# Patient Record
Sex: Male | Born: 1970 | Race: White | Hispanic: No | Marital: Married | Smoking: Former smoker
Health system: Southern US, Community
[De-identification: ages and names within clinical notes are randomized; demographics above are authoritative.]

## PROBLEM LIST (undated history)

## (undated) DIAGNOSIS — M7062 Trochanteric bursitis, left hip: Secondary | ICD-10-CM

## (undated) DIAGNOSIS — M2170 Unequal limb length (acquired), unspecified site: Secondary | ICD-10-CM

## (undated) DIAGNOSIS — D649 Anemia, unspecified: Secondary | ICD-10-CM

## (undated) DIAGNOSIS — M1990 Unspecified osteoarthritis, unspecified site: Secondary | ICD-10-CM

## (undated) DIAGNOSIS — M169 Osteoarthritis of hip, unspecified: Secondary | ICD-10-CM

## (undated) DIAGNOSIS — F32A Depression, unspecified: Secondary | ICD-10-CM

## (undated) DIAGNOSIS — M300 Polyarteritis nodosa: Secondary | ICD-10-CM

## (undated) DIAGNOSIS — M419 Scoliosis, unspecified: Secondary | ICD-10-CM

## (undated) DIAGNOSIS — D751 Secondary polycythemia: Secondary | ICD-10-CM

## (undated) HISTORY — PX: WISDOM TOOTH EXTRACTION: SHX21

## (undated) HISTORY — PX: BONE MARROW BIOPSY: SHX199

---

## 2011-06-18 ENCOUNTER — Ambulatory Visit: Payer: Self-pay | Admitting: Sports Medicine

## 2011-08-17 ENCOUNTER — Ambulatory Visit: Payer: Self-pay | Admitting: Orthopedic Surgery

## 2019-12-31 ENCOUNTER — Other Ambulatory Visit: Payer: Self-pay | Admitting: Orthopedic Surgery

## 2020-01-02 ENCOUNTER — Encounter
Admission: RE | Admit: 2020-01-02 | Discharge: 2020-01-02 | Disposition: A | Discharge status: Home / Self Care | Payer: Managed Care, Other (non HMO) | Source: Ambulatory Visit | Attending: Orthopedic Surgery | Admitting: Orthopedic Surgery

## 2020-01-02 ENCOUNTER — Other Ambulatory Visit: Payer: Self-pay

## 2020-01-02 DIAGNOSIS — Z01818 Encounter for other preprocedural examination: Secondary | ICD-10-CM | POA: Diagnosis not present

## 2020-01-02 DIAGNOSIS — Z01810 Encounter for preprocedural cardiovascular examination: Secondary | ICD-10-CM

## 2020-01-02 HISTORY — DX: Anemia, unspecified: D64.9

## 2020-01-02 HISTORY — DX: Depression, unspecified: F32.A

## 2020-01-02 HISTORY — DX: Polyarteritis nodosa: M30.0

## 2020-01-02 HISTORY — DX: Unspecified osteoarthritis, unspecified site: M19.90

## 2020-01-02 HISTORY — DX: Secondary polycythemia: D75.1

## 2020-01-02 HISTORY — DX: Scoliosis, unspecified: M41.9

## 2020-01-02 HISTORY — DX: Osteoarthritis of hip, unspecified: M16.9

## 2020-01-02 HISTORY — DX: Trochanteric bursitis, left hip: M70.62

## 2020-01-02 HISTORY — DX: Unequal limb length (acquired), unspecified site: M21.70

## 2020-01-02 LAB — CBC WITH DIFFERENTIAL/PLATELET
Abs Immature Granulocytes: 0.01 10*3/uL (ref 0.00–0.07)
Basophils Absolute: 0.1 10*3/uL (ref 0.0–0.1)
Basophils Relative: 1 %
Eosinophils Absolute: 0.1 10*3/uL (ref 0.0–0.5)
Eosinophils Relative: 1 %
HCT: 50.1 % (ref 39.0–52.0)
Hemoglobin: 17.7 g/dL — ABNORMAL HIGH (ref 13.0–17.0)
Immature Granulocytes: 0 %
Lymphocytes Relative: 24 %
Lymphs Abs: 1.7 10*3/uL (ref 0.7–4.0)
MCH: 31.6 pg (ref 26.0–34.0)
MCHC: 35.3 g/dL (ref 30.0–36.0)
MCV: 89.3 fL (ref 80.0–100.0)
Monocytes Absolute: 0.6 10*3/uL (ref 0.1–1.0)
Monocytes Relative: 9 %
Neutro Abs: 4.6 10*3/uL (ref 1.7–7.7)
Neutrophils Relative %: 65 %
Platelets: 304 10*3/uL (ref 150–400)
RBC: 5.61 MIL/uL (ref 4.22–5.81)
RDW: 12.7 % (ref 11.5–15.5)
WBC: 7.1 10*3/uL (ref 4.0–10.5)
nRBC: 0 % (ref 0.0–0.2)

## 2020-01-02 LAB — COMPREHENSIVE METABOLIC PANEL
ALT: 46 U/L — ABNORMAL HIGH (ref 0–44)
AST: 31 U/L (ref 15–41)
Albumin: 5 g/dL (ref 3.5–5.0)
Alkaline Phosphatase: 58 U/L (ref 38–126)
Anion gap: 10 (ref 5–15)
BUN: 17 mg/dL (ref 6–20)
CO2: 27 mmol/L (ref 22–32)
Calcium: 9.5 mg/dL (ref 8.9–10.3)
Chloride: 102 mmol/L (ref 98–111)
Creatinine, Ser: 0.95 mg/dL (ref 0.61–1.24)
GFR calc Af Amer: >60 mL/min (ref >60)
GFR calc non Af Amer: >60 mL/min (ref >60)
Glucose, Bld: 79 mg/dL (ref 70–99)
Potassium: 4 mmol/L (ref 3.5–5.1)
Sodium: 139 mmol/L (ref 135–145)
Total Bilirubin: 1 mg/dL (ref 0.3–1.2)
Total Protein: 7.8 g/dL (ref 6.5–8.1)

## 2020-01-02 LAB — URINALYSIS, ROUTINE W REFLEX MICROSCOPIC
Bilirubin Urine: NEGATIVE
Glucose, UA: NEGATIVE mg/dL
Hgb urine dipstick: NEGATIVE
Ketones, ur: 5 mg/dL — AB
Leukocytes,Ua: NEGATIVE
Nitrite: NEGATIVE
Protein, ur: NEGATIVE mg/dL
Specific Gravity, Urine: 1.011 (ref 1.005–1.030)
pH: 5 (ref 5.0–8.0)

## 2020-01-02 LAB — SURGICAL PCR SCREEN
MRSA, PCR: NEGATIVE
Staphylococcus aureus: NEGATIVE

## 2020-01-02 NOTE — Patient Instructions (Addendum)
Your procedure is scheduled on: 10-06- 2021  Report to Day Surgery on the 2nd floor of the [facility]. To find out your arrival time, please call [phone] between 1PM - 3PM on: 10- 04- 2021  REMEMBER: Instructions that are not followed completely may result in serious medical risk, up to and including death; or upon the discretion of your surgeon and anesthesiologist your surgery may need to be rescheduled.  Do not eat food after midnight the night before surgery.  No gum chewing, lozengers or hard candies.  You may however, drink CLEAR liquids up to 2 hours before you are scheduled to arrive for your surgery. Do not drink anything within 2 hours of your scheduled arrival time.  Clear liquids include: - water  - apple juice without pulp - gatorade (not RED) - black coffee or tea (Do NOT add milk or creamers to the coffee or tea) Do NOT drink anything that is not on this list.    In addition, your doctor has ordered for you to drink the provided  Ensure Pre-Surgery Clear Carbohydrate Drink  Gatorade G2 Drinking this carbohydrate drink up to two hours before surgery helps to reduce insulin resistance and improve patient outcomes. Please complete drinking 2 hours prior to scheduled arrival time. Helps reduce nausea.  TAKE THESE MEDICATIONS THE MORNING OF SURGERY WITH A SIP OF WATER:   - may take Tramadol if needed the morning of surgery.  Famotidine 20 mg ,take one the night before and one on the morning of surgery - helps to prevent nausea after surgery.   Follow recommendations from Cardiologist, Pulmonologist or PCP regarding stopping Aspirin, Coumadin, Plavix, Eliquis, Pradaxa, or Pletal.  One week prior to surgery: Stop Anti-inflammatories (NSAIDS) such as Meloxicam, Advil, Aleve, Ibuprofen, Motrin, Naproxen, Naprosyn and Aspirin based products such as Excedrin, Goodys Powder, BC Powder. Stop ANY OVER THE COUNTER supplements until after surgery - Fish Oil supplements 1  week prior to surgery. (You may continue taking Tylenol, Vitamin D, Vitamin B, and multivitamin.)  No Alcohol for 24 hours before or after surgery.  No Smoking including e-cigarettes for 24 hours prior to surgery.  No chewable tobacco products for at least 6 hours prior to surgery.  No nicotine patches on the day of surgery.  Do not use any "recreational" drugs for at least a week prior to your surgery.  Please be advised that the combination of cocaine and anesthesia may have negative outcomes, up to and including death. If you test positive for cocaine, your surgery will be cancelled.  On the morning of surgery brush your teeth with toothpaste and water, you may rinse your mouth with mouthwash if you wish. Do not swallow any toothpaste or mouthwash.  Do not wear jewelry, make-up, hairpins, clips or nail polish.  Do not wear lotions, deordorant,  powders, or perfumes.   Do not shave 48 hours prior to surgery.   Contact lenses, hearing aids and dentures may not be worn into surgery.  Do not bring valuables to the hospital. [facility] is not responsible for any missing/lost belongings or valuables.   Use CHG Soap or wipes as directed on instruction sheet.  Bring your C-PAP to the hospital with you in case you may have to spend the night.   Notify your doctor if there is any change in your medical condition (cold, fever, infection).  Wear comfortable clothing (specific to your surgery type) to the hospital.  Plan for stool softeners for home use; pain medications  have a tendency to cause constipation. You can also help prevent constipation by eating foods high in fiber such as fruits and vegetables and drinking plenty of fluids as your diet allows.  After surgery, you can help prevent lung complications by doing breathing exercises.  Take deep breaths and cough every 1-2 hours. Your doctor may order a device called an Incentive Spirometer to help you take deep breaths. When  coughing or sneezing, hold a pillow firmly against your incision with both hands. This is called "splinting." Doing this helps protect your incision. It also decreases belly discomfort.  If you are being admitted to the hospital overnight, leave your suitcase in the car. After surgery it may be brought to your room.  If you are being discharged the day of surgery, you will not be allowed to drive home. You will need a responsible adult (18 years or older) to drive you home and stay with you that night.   If you are taking public transportation, you will need to have a responsible adult (18 years or older) with you. Please confirm with your physician that it is acceptable to use public transportation.   Please call the Pre-admissions Testing Dept. at [phone] if you have any questions about these instructions.  Visitation Policy:  Patients undergoing a surgery or procedure may have one family member or support person with them as long as that person is not COVID-19 positive or experiencing its symptoms.  That person may remain in the waiting area during the procedure.  Inpatient Visitation Update:   In an effort to ensure the safety of our team members and our patients, we are implementing a change to our visitation policy:  Effective Monday, Aug. 9, at 7 a.m., inpatients will be allowed one support person.  o The support person may change daily.  o The support person must pass our screening, gel in and out, and wear a mask at all times, including in the patient's room.  o Patients must also wear a mask when staff or their support person are in the room.  o Masking is required regardless of vaccination status.  Systemwide, no visitors 17 or younger.

## 2020-01-03 LAB — ABO/RH: ABO/RH(D): O POS

## 2020-01-04 ENCOUNTER — Other Ambulatory Visit
Admission: RE | Admit: 2020-01-04 | Discharge: 2020-01-04 | Disposition: A | Discharge status: Home / Self Care | Payer: Managed Care, Other (non HMO) | Source: Ambulatory Visit | Attending: Orthopedic Surgery | Admitting: Orthopedic Surgery

## 2020-01-04 ENCOUNTER — Other Ambulatory Visit: Payer: Self-pay

## 2020-01-04 DIAGNOSIS — Z20822 Contact with and (suspected) exposure to covid-19: Secondary | ICD-10-CM | POA: Diagnosis not present

## 2020-01-04 DIAGNOSIS — Z01812 Encounter for preprocedural laboratory examination: Secondary | ICD-10-CM | POA: Diagnosis not present

## 2020-01-05 LAB — SARS CORONAVIRUS 2 (TAT 6-24 HRS): SARS Coronavirus 2: NEGATIVE

## 2020-01-08 ENCOUNTER — Ambulatory Visit: Payer: Managed Care, Other (non HMO)

## 2020-01-08 ENCOUNTER — Ambulatory Visit
Admission: RE | Admit: 2020-01-08 | Discharge: 2020-01-08 | Disposition: A | Discharge status: Home / Self Care | Payer: Managed Care, Other (non HMO) | Attending: Orthopedic Surgery | Admitting: Orthopedic Surgery

## 2020-01-08 ENCOUNTER — Encounter
Admission: RE | Disposition: A | Discharge status: Home / Self Care | Payer: Self-pay | Source: Home / Self Care | Attending: Orthopedic Surgery

## 2020-01-08 ENCOUNTER — Other Ambulatory Visit: Payer: Self-pay

## 2020-01-08 ENCOUNTER — Encounter: Payer: Self-pay | Admitting: Orthopedic Surgery

## 2020-01-08 ENCOUNTER — Ambulatory Visit: Payer: Managed Care, Other (non HMO) | Admitting: Anesthesiology

## 2020-01-08 ENCOUNTER — Ambulatory Visit: Payer: Managed Care, Other (non HMO) | Admitting: Urgent Care

## 2020-01-08 DIAGNOSIS — Z79899 Other long term (current) drug therapy: Secondary | ICD-10-CM | POA: Diagnosis not present

## 2020-01-08 DIAGNOSIS — Z8261 Family history of arthritis: Secondary | ICD-10-CM | POA: Diagnosis not present

## 2020-01-08 DIAGNOSIS — M1612 Unilateral primary osteoarthritis, left hip: Secondary | ICD-10-CM | POA: Insufficient documentation

## 2020-01-08 DIAGNOSIS — M419 Scoliosis, unspecified: Secondary | ICD-10-CM | POA: Insufficient documentation

## 2020-01-08 DIAGNOSIS — M2170 Unequal limb length (acquired), unspecified site: Secondary | ICD-10-CM | POA: Diagnosis not present

## 2020-01-08 DIAGNOSIS — Z419 Encounter for procedure for purposes other than remedying health state, unspecified: Secondary | ICD-10-CM

## 2020-01-08 DIAGNOSIS — Z881 Allergy status to other antibiotic agents status: Secondary | ICD-10-CM | POA: Insufficient documentation

## 2020-01-08 DIAGNOSIS — G8918 Other acute postprocedural pain: Secondary | ICD-10-CM

## 2020-01-08 DIAGNOSIS — R2681 Unsteadiness on feet: Secondary | ICD-10-CM | POA: Diagnosis not present

## 2020-01-08 DIAGNOSIS — D751 Secondary polycythemia: Secondary | ICD-10-CM | POA: Diagnosis not present

## 2020-01-08 DIAGNOSIS — E669 Obesity, unspecified: Secondary | ICD-10-CM | POA: Diagnosis not present

## 2020-01-08 DIAGNOSIS — Z6829 Body mass index (BMI) 29.0-29.9, adult: Secondary | ICD-10-CM | POA: Diagnosis not present

## 2020-01-08 HISTORY — PX: TOTAL HIP ARTHROPLASTY: SHX124

## 2020-01-08 LAB — URINE DRUG SCREEN, QUALITATIVE (ARMC ONLY)
Amphetamines, Ur Screen: NOT DETECTED
Barbiturates, Ur Screen: NOT DETECTED
Benzodiazepine, Ur Scrn: NOT DETECTED
Cannabinoid 50 Ng, Ur ~~LOC~~: POSITIVE — AB
Cocaine Metabolite,Ur ~~LOC~~: NOT DETECTED
MDMA (Ecstasy)Ur Screen: NOT DETECTED
Methadone Scn, Ur: NOT DETECTED
Opiate, Ur Screen: NOT DETECTED
Phencyclidine (PCP) Ur S: NOT DETECTED
Tricyclic, Ur Screen: NOT DETECTED

## 2020-01-08 LAB — CBC
HCT: 42.5 % (ref 39.0–52.0)
Hemoglobin: 15.1 g/dL (ref 13.0–17.0)
MCH: 31.9 pg (ref 26.0–34.0)
MCHC: 35.5 g/dL (ref 30.0–36.0)
MCV: 89.9 fL (ref 80.0–100.0)
Platelets: 250 10*3/uL (ref 150–400)
RBC: 4.73 MIL/uL (ref 4.22–5.81)
RDW: 12.8 % (ref 11.5–15.5)
WBC: 12.9 10*3/uL — ABNORMAL HIGH (ref 4.0–10.5)
nRBC: 0 % (ref 0.0–0.2)

## 2020-01-08 LAB — TYPE AND SCREEN
ABO/RH(D): O POS
ABO/RH(D): O POS
Antibody Screen: NEGATIVE
Antibody Screen: NEGATIVE

## 2020-01-08 LAB — CREATININE, SERUM
Creatinine, Ser: 0.98 mg/dL (ref 0.61–1.24)
GFR calc non Af Amer: >60 mL/min (ref >60)

## 2020-01-08 SURGERY — ARTHROPLASTY, HIP, TOTAL, ANTERIOR APPROACH
Anesthesia: Spinal | Site: Hip | Laterality: Left

## 2020-01-08 MED ORDER — ONDANSETRON HCL 4 MG PO TABS: 4.0000 mg | ORAL_TABLET | Freq: Four times a day (QID) | ORAL | Status: DC | PRN

## 2020-01-08 MED ORDER — ONDANSETRON HCL 4 MG/2ML IJ SOLN: 4.0000 mg | Freq: Four times a day (QID) | INTRAMUSCULAR | Status: DC | PRN

## 2020-01-08 MED ORDER — DOCUSATE SODIUM 100 MG PO CAPS
100.0000 mg | ORAL_CAPSULE | Freq: Two times a day (BID) | ORAL | Status: DC
  Administered 2020-01-08: 100 mg via ORAL
  Filled 2020-01-08: qty 1

## 2020-01-08 MED ORDER — GLYCOPYRROLATE 0.2 MG/ML IJ SOLN
INTRAMUSCULAR | Status: DC | PRN
  Administered 2020-01-08: .2 mg via INTRAVENOUS

## 2020-01-08 MED ORDER — BUPIVACAINE HCL (PF) 0.5 % IJ SOLN
INTRAMUSCULAR | Status: AC
  Filled 2020-01-08: qty 10

## 2020-01-08 MED ORDER — PHENOL 1.4 % MT LIQD
1.0000 | OROMUCOSAL | Status: DC | PRN
  Filled 2020-01-08: qty 177

## 2020-01-08 MED ORDER — PHENYLEPHRINE HCL (PRESSORS) 10 MG/ML IV SOLN
INTRAVENOUS | Status: DC | PRN
  Administered 2020-01-08: 100 ug via INTRAVENOUS

## 2020-01-08 MED ORDER — LIDOCAINE HCL (PF) 2 % IJ SOLN
INTRAMUSCULAR | Status: AC
  Filled 2020-01-08: qty 5

## 2020-01-08 MED ORDER — CHLORHEXIDINE GLUCONATE 0.12 % MT SOLN: 15.0000 mL | Freq: Once | OROMUCOSAL | Status: AC

## 2020-01-08 MED ORDER — CEFAZOLIN SODIUM-DEXTROSE 2-4 GM/100ML-% IV SOLN
INTRAVENOUS | Status: AC
  Administered 2020-01-08: 2 g via INTRAVENOUS
  Filled 2020-01-08: qty 100

## 2020-01-08 MED ORDER — LACTATED RINGERS IV SOLN: INTRAVENOUS | Status: DC

## 2020-01-08 MED ORDER — METOCLOPRAMIDE HCL 5 MG/ML IJ SOLN: 5.0000 mg | Freq: Three times a day (TID) | INTRAMUSCULAR | Status: DC | PRN

## 2020-01-08 MED ORDER — FENTANYL CITRATE (PF) 100 MCG/2ML IJ SOLN
INTRAMUSCULAR | Status: DC | PRN
  Administered 2020-01-08: 100 ug via INTRAVENOUS

## 2020-01-08 MED ORDER — CEFAZOLIN SODIUM-DEXTROSE 2-4 GM/100ML-% IV SOLN
2.0000 g | INTRAVENOUS | Status: AC
  Administered 2020-01-08: 2 g via INTRAVENOUS

## 2020-01-08 MED ORDER — BUPIVACAINE LIPOSOME 1.3 % IJ SUSP
INTRAMUSCULAR | Status: AC
  Filled 2020-01-08: qty 20

## 2020-01-08 MED ORDER — ACETAMINOPHEN 500 MG PO TABS
ORAL_TABLET | ORAL | Status: AC
  Administered 2020-01-08: 1000 mg via ORAL
  Filled 2020-01-08: qty 2

## 2020-01-08 MED ORDER — ENOXAPARIN SODIUM 40 MG/0.4ML ~~LOC~~ SOLN: 40.0000 mg | SUBCUTANEOUS | Status: DC

## 2020-01-08 MED ORDER — ACETAMINOPHEN 325 MG PO TABS: 325.0000 mg | ORAL_TABLET | Freq: Four times a day (QID) | ORAL | Status: DC | PRN

## 2020-01-08 MED ORDER — METOCLOPRAMIDE HCL 10 MG PO TABS: 5.0000 mg | ORAL_TABLET | Freq: Three times a day (TID) | ORAL | Status: DC | PRN

## 2020-01-08 MED ORDER — ORAL CARE MOUTH RINSE: 15.0000 mL | Freq: Once | OROMUCOSAL | Status: AC

## 2020-01-08 MED ORDER — FENTANYL CITRATE (PF) 100 MCG/2ML IJ SOLN: 25.0000 ug | INTRAMUSCULAR | Status: DC | PRN

## 2020-01-08 MED ORDER — FENTANYL CITRATE (PF) 100 MCG/2ML IJ SOLN
INTRAMUSCULAR | Status: AC
  Filled 2020-01-08: qty 2

## 2020-01-08 MED ORDER — CEFAZOLIN SODIUM-DEXTROSE 2-4 GM/100ML-% IV SOLN
INTRAVENOUS | Status: AC
  Filled 2020-01-08: qty 100

## 2020-01-08 MED ORDER — GLYCOPYRROLATE 0.2 MG/ML IJ SOLN
INTRAMUSCULAR | Status: AC
  Filled 2020-01-08: qty 1

## 2020-01-08 MED ORDER — EPHEDRINE 5 MG/ML INJ
INTRAVENOUS | Status: AC
  Filled 2020-01-08: qty 10

## 2020-01-08 MED ORDER — ACETAMINOPHEN 10 MG/ML IV SOLN
INTRAVENOUS | Status: AC
  Filled 2020-01-08: qty 100

## 2020-01-08 MED ORDER — CEFAZOLIN SODIUM-DEXTROSE 2-4 GM/100ML-% IV SOLN: 2.0000 g | Freq: Four times a day (QID) | INTRAVENOUS | Status: DC

## 2020-01-08 MED ORDER — MENTHOL 3 MG MT LOZG
1.0000 | LOZENGE | OROMUCOSAL | Status: DC | PRN
  Filled 2020-01-08: qty 9

## 2020-01-08 MED ORDER — KETAMINE HCL 50 MG/ML IJ SOLN
INTRAMUSCULAR | Status: DC | PRN
  Administered 2020-01-08: 50 mg via INTRAVENOUS

## 2020-01-08 MED ORDER — ENOXAPARIN SODIUM 40 MG/0.4ML ~~LOC~~ SOLN: 40.0000 mg | SUBCUTANEOUS | 0 refills | Status: AC

## 2020-01-08 MED ORDER — PROPOFOL 10 MG/ML IV BOLUS
INTRAVENOUS | Status: AC
  Filled 2020-01-08: qty 40

## 2020-01-08 MED ORDER — SODIUM CHLORIDE 0.9 % IV SOLN
INTRAVENOUS | Status: DC | PRN
  Administered 2020-01-08: 60 mL

## 2020-01-08 MED ORDER — ONDANSETRON HCL 4 MG/2ML IJ SOLN: 4.0000 mg | Freq: Once | INTRAMUSCULAR | Status: DC | PRN

## 2020-01-08 MED ORDER — HYDROMORPHONE HCL 1 MG/ML IJ SOLN: 0.5000 mg | INTRAMUSCULAR | Status: DC | PRN

## 2020-01-08 MED ORDER — MIDAZOLAM HCL 5 MG/5ML IJ SOLN
INTRAMUSCULAR | Status: DC | PRN
  Administered 2020-01-08: 2 mg via INTRAVENOUS
  Administered 2020-01-08 (×2): 1 mg via INTRAVENOUS

## 2020-01-08 MED ORDER — LACTATED RINGERS IV BOLUS
250.0000 mL | Freq: Once | INTRAVENOUS | Status: AC
  Administered 2020-01-08: 250 mL via INTRAVENOUS

## 2020-01-08 MED ORDER — MIDAZOLAM HCL 2 MG/2ML IJ SOLN
INTRAMUSCULAR | Status: AC
  Filled 2020-01-08: qty 2

## 2020-01-08 MED ORDER — ACETAMINOPHEN 10 MG/ML IV SOLN
INTRAVENOUS | Status: DC | PRN
  Administered 2020-01-08: 1000 mg via INTRAVENOUS

## 2020-01-08 MED ORDER — EPHEDRINE SULFATE 50 MG/ML IJ SOLN
INTRAMUSCULAR | Status: DC | PRN
  Administered 2020-01-08: 10 mg via INTRAVENOUS
  Administered 2020-01-08 (×2): 5 mg via INTRAVENOUS
  Administered 2020-01-08: 10 mg via INTRAVENOUS

## 2020-01-08 MED ORDER — TRANEXAMIC ACID-NACL 1000-0.7 MG/100ML-% IV SOLN: 1000.0000 mg | Freq: Once | INTRAVENOUS | Status: AC

## 2020-01-08 MED ORDER — SODIUM CHLORIDE 0.9 % IV SOLN: INTRAVENOUS | Status: DC

## 2020-01-08 MED ORDER — METHOCARBAMOL 500 MG PO TABS: 500.0000 mg | ORAL_TABLET | Freq: Four times a day (QID) | ORAL | Status: DC | PRN

## 2020-01-08 MED ORDER — SODIUM CHLORIDE FLUSH 0.9 % IV SOLN
INTRAVENOUS | Status: AC
  Filled 2020-01-08: qty 40

## 2020-01-08 MED ORDER — LIDOCAINE HCL (PF) 2 % IJ SOLN
INTRAMUSCULAR | Status: DC | PRN
  Administered 2020-01-08: 50 mg

## 2020-01-08 MED ORDER — BUPIVACAINE HCL (PF) 0.5 % IJ SOLN
INTRAMUSCULAR | Status: DC | PRN
  Administered 2020-01-08: 3 mL via INTRATHECAL

## 2020-01-08 MED ORDER — METHOCARBAMOL 1000 MG/10ML IJ SOLN
500.0000 mg | Freq: Four times a day (QID) | INTRAVENOUS | Status: DC | PRN
  Filled 2020-01-08: qty 5

## 2020-01-08 MED ORDER — OXYCODONE HCL 5 MG PO TABS: 5.0000 mg | ORAL_TABLET | ORAL | 0 refills | Status: AC | PRN

## 2020-01-08 MED ORDER — ACETAMINOPHEN 500 MG PO TABS: 1000.0000 mg | ORAL_TABLET | Freq: Four times a day (QID) | ORAL | Status: DC

## 2020-01-08 MED ORDER — LACTATED RINGERS IV BOLUS
500.0000 mL | Freq: Once | INTRAVENOUS | Status: AC
  Administered 2020-01-08: 500 mL via INTRAVENOUS

## 2020-01-08 MED ORDER — OXYCODONE HCL 5 MG PO TABS
ORAL_TABLET | ORAL | Status: AC
  Filled 2020-01-08: qty 1

## 2020-01-08 MED ORDER — NEOMYCIN-POLYMYXIN B GU 40-200000 IR SOLN
Status: AC
  Filled 2020-01-08: qty 4

## 2020-01-08 MED ORDER — NEOMYCIN-POLYMYXIN B GU 40-200000 IR SOLN
Status: DC | PRN
  Administered 2020-01-08: 4 mL

## 2020-01-08 MED ORDER — OXYCODONE HCL 5 MG PO TABS
5.0000 mg | ORAL_TABLET | ORAL | Status: DC | PRN
  Administered 2020-01-08: 10 mg via ORAL
  Filled 2020-01-08 (×3): qty 2

## 2020-01-08 MED ORDER — BUPIVACAINE-EPINEPHRINE (PF) 0.25% -1:200000 IJ SOLN
INTRAMUSCULAR | Status: AC
  Filled 2020-01-08: qty 30

## 2020-01-08 MED ORDER — OXYCODONE HCL 5 MG PO TABS
ORAL_TABLET | ORAL | Status: AC
  Administered 2020-01-08: 5 mg via ORAL
  Filled 2020-01-08: qty 2

## 2020-01-08 MED ORDER — KETAMINE HCL 50 MG/ML IJ SOLN
INTRAMUSCULAR | Status: AC
  Filled 2020-01-08: qty 10

## 2020-01-08 MED ORDER — PROPOFOL 500 MG/50ML IV EMUL
INTRAVENOUS | Status: DC | PRN
  Administered 2020-01-08: 35 ug/kg/min via INTRAVENOUS

## 2020-01-08 MED ORDER — OXYCODONE HCL 5 MG PO TABS
10.0000 mg | ORAL_TABLET | ORAL | Status: DC | PRN
  Filled 2020-01-08: qty 3

## 2020-01-08 MED ORDER — CHLORHEXIDINE GLUCONATE 0.12 % MT SOLN
OROMUCOSAL | Status: AC
  Administered 2020-01-08: 15 mL via OROMUCOSAL
  Filled 2020-01-08: qty 15

## 2020-01-08 MED ORDER — TRANEXAMIC ACID-NACL 1000-0.7 MG/100ML-% IV SOLN
INTRAVENOUS | Status: AC
  Administered 2020-01-08: 1000 mg via INTRAVENOUS
  Filled 2020-01-08: qty 100

## 2020-01-08 SURGICAL SUPPLY — 63 items
BLADE SAGITTAL AGGR TOOTH XLG (BLADE) ×3 IMPLANT
BNDG COHESIVE 6X5 TAN STRL LF (GAUZE/BANDAGES/DRESSINGS) ×9 IMPLANT
CANISTER SUCT 1200ML W/VALVE (MISCELLANEOUS) ×3 IMPLANT
CANISTER WOUND CARE 500ML ATS (WOUND CARE) ×3 IMPLANT
CHLORAPREP W/TINT 26 (MISCELLANEOUS) ×3 IMPLANT
COVER BACK TABLE REUSABLE LG (DRAPES) ×3 IMPLANT
COVER WAND RF STERILE (DRAPES) ×3 IMPLANT
DRAPE 3/4 80X56 (DRAPES) ×9 IMPLANT
DRAPE C-ARM XRAY 36X54 (DRAPES) ×3 IMPLANT
DRAPE INCISE IOBAN 66X60 STRL (DRAPES) IMPLANT
DRAPE POUCH INSTRU U-SHP 10X18 (DRAPES) ×3 IMPLANT
DRESSING SURGICEL FIBRLLR 1X2 (HEMOSTASIS) ×2 IMPLANT
DRSG MEPILEX SACRM 8.7X9.8 (GAUZE/BANDAGES/DRESSINGS) ×3 IMPLANT
DRSG OPSITE POSTOP 4X8 (GAUZE/BANDAGES/DRESSINGS) ×6 IMPLANT
DRSG SURGICEL FIBRILLAR 1X2 (HEMOSTASIS) ×6
ELECT BLADE 6.5 EXT (BLADE) ×3 IMPLANT
ELECT REM PT RETURN 9FT ADLT (ELECTROSURGICAL) ×3
ELECTRODE REM PT RTRN 9FT ADLT (ELECTROSURGICAL) ×1 IMPLANT
GLOVE BIOGEL PI IND STRL 9 (GLOVE) ×1 IMPLANT
GLOVE BIOGEL PI INDICATOR 9 (GLOVE) ×2
GLOVE SURG SYN 9.0  PF PI (GLOVE) ×4
GLOVE SURG SYN 9.0 PF PI (GLOVE) ×2 IMPLANT
GOWN SRG 2XL LVL 4 RGLN SLV (GOWNS) ×1 IMPLANT
GOWN STRL NON-REIN 2XL LVL4 (GOWNS) ×2
GOWN STRL REUS W/ TWL LRG LVL3 (GOWN DISPOSABLE) ×1 IMPLANT
GOWN STRL REUS W/TWL LRG LVL3 (GOWN DISPOSABLE) ×2
HEAD FEMORAL 28MM SZ S (Head) ×2 IMPLANT
HEMOVAC 400CC 10FR (MISCELLANEOUS) IMPLANT
HOLDER FOLEY CATH W/STRAP (MISCELLANEOUS) ×3 IMPLANT
HOOD PEEL AWAY FLYTE STAYCOOL (MISCELLANEOUS) ×3 IMPLANT
IRRIGATION SURGIPHOR STRL (IV SOLUTION) IMPLANT
KIT PREVENA INCISION MGT 13 (CANNISTER) ×3 IMPLANT
LINER DM 28MM (Liner) ×3 IMPLANT
LINER DM SZH 28X56 (Liner) IMPLANT
MASTERLOC HIP LATERAL S6 (Hips) ×2 IMPLANT
MAT ABSORB  FLUID 56X50 GRAY (MISCELLANEOUS) ×2
MAT ABSORB FLUID 56X50 GRAY (MISCELLANEOUS) ×1 IMPLANT
NDL SAFETY ECLIPSE 18X1.5 (NEEDLE) ×1 IMPLANT
NDL SPNL 20GX3.5 QUINCKE YW (NEEDLE) ×2 IMPLANT
NEEDLE HYPO 18GX1.5 SHARP (NEEDLE) ×2
NEEDLE SPNL 20GX3.5 QUINCKE YW (NEEDLE) ×6 IMPLANT
NS IRRIG 1000ML POUR BTL (IV SOLUTION) ×3 IMPLANT
PACK HIP COMPR (MISCELLANEOUS) ×3 IMPLANT
SCALPEL PROTECTED #10 DISP (BLADE) ×6 IMPLANT
SHELL ACETABULAR DM  56MM (Shell) ×2 IMPLANT
SOL PREP PVP 2OZ (MISCELLANEOUS) ×3
SOLUTION PREP PVP 2OZ (MISCELLANEOUS) ×1 IMPLANT
SPONGE DRAIN TRACH 4X4 STRL 2S (GAUZE/BANDAGES/DRESSINGS) ×3 IMPLANT
STAPLER SKIN PROX 35W (STAPLE) ×3 IMPLANT
STRAP SAFETY 5IN WIDE (MISCELLANEOUS) ×3 IMPLANT
SUT DVC 2 QUILL PDO  T11 36X36 (SUTURE) ×2
SUT DVC 2 QUILL PDO T11 36X36 (SUTURE) ×1 IMPLANT
SUT SILK 0 (SUTURE) ×2
SUT SILK 0 30XBRD TIE 6 (SUTURE) ×1 IMPLANT
SUT V-LOC 90 ABS DVC 3-0 CL (SUTURE) ×3 IMPLANT
SUT VIC AB 1 CT1 36 (SUTURE) ×3 IMPLANT
SYR 20ML LL LF (SYRINGE) ×3 IMPLANT
SYR 30ML LL (SYRINGE) ×3 IMPLANT
SYR 50ML LL SCALE MARK (SYRINGE) ×6 IMPLANT
SYR BULB IRRIG 60ML STRL (SYRINGE) ×3 IMPLANT
TAPE MICROFOAM 4IN (TAPE) ×3 IMPLANT
TOWEL OR 17X26 4PK STRL BLUE (TOWEL DISPOSABLE) ×3 IMPLANT
TRAY FOLEY MTR SLVR 16FR STAT (SET/KITS/TRAYS/PACK) ×3 IMPLANT

## 2020-01-08 NOTE — Transfer of Care (Signed)
Immediate Anesthesia Transfer of Care Note  Patient: RANEY KOEPPEN  Procedure(s) Performed: TOTAL HIP ARTHROPLASTY ANTERIOR APPROACH (Left Hip)  Patient Location: PACU  Anesthesia Type:Spinal  Level of Consciousness: awake, alert  and oriented  Airway & Oxygen Therapy: Patient Spontanous Breathing  Post-op Assessment: Report given to RN and Post -op Vital signs reviewed and stable  Post vital signs: Reviewed  Last Vitals:  Vitals Value Taken Time  BP    Temp    Pulse 94 01/08/20 0900  Resp    SpO2 98 % 01/08/20 0900  Vitals shown include unvalidated device data.  Last Pain:  Vitals:   01/08/20 0641  TempSrc: Oral  PainSc: 5          Complications: No complications documented.

## 2020-01-08 NOTE — H&P (Signed)
Chief Complaint  Patient presents with  . Establish Care  Lt Hip OA    History of the Present Illness: Edwin Bryant is a 49 y.o. male here today.   The patient presents for evaluation of left hip osteoarthritis. He had x-rays in 09/25/2019. These show severe osteoarthritis of the left hip. Extensive osteophytes around the femoral head and neck. The right hip is nearly as involved. Lateral view shows subchondral cyst, large osteophytes at the medial acetabulum, as well as posterior femoral head and anterior femoral head. He had a corticosteroid injection by Edwin Jungling, MD, and sent that x-ray.  The patient states his right hip is not as painful as it once was. The left hip pain has become progressively worse over time, faster than his right. He locates his pain to the groin area with radiation to the left shin muscle that is a throbbing pain at times. The patient states the left hip injection helped for approximately 2 weeks. He states this period of relief is the shortest amount of time from all the injections he has had.   The patient presents with a male companion who states when the patient was approximately 49 years old he was treated with massive doses of steroids.   The patient has a family history of osteoarthritis. He states his mother had similar issues and had to have a hip arthroplasty approximately 5 years ago or 4 years ago.  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 He enjoys walking, exercising, and playing golf.   I have reviewed past medical, surgical, social and family history, and allergies as documented in the EMR.  Past Medical History: Past Medical History:  Diagnosis Date  . Allergic state  . Depression 01/27/2011  . 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 Leg length discrepancy 09/16/2017  . 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 Scoliosis 09/16/2017   Past Surgical History: Past Surgical History:  Procedure  Laterality Date  . BIOPSY BONE MARROW  . wisdom teeth extraction   Past Family History: Family History  Problem Relation Age of Onset  . Arthritis Mother  . Arthritis Father   Medications: Current Outpatient Medications Ordered in Epic  Medication Sig Dispense Refill  . cetirizine (ZYRTEC) 10 MG tablet Take 10 mg by mouth as needed for Allergies  . fluticasone propionate (FLONASE) 50 mcg/actuation nasal spray Place 2 sprays into both nostrils once daily as needed for Allergies  . loratadine (CLARITIN) 10 mg tablet Take 10 mg by mouth once daily  . meloxicam (MOBIC) 15 MG tablet Take 1 tablet by mouth once daily  . multivitamin tablet Take 1 tablet by mouth once daily  . naproxen sodium (ALEVE) 220 MG tablet Take 220 mg by mouth 2 (two) times daily as needed for Pain  . traMADoL (ULTRAM) 50 mg tablet Take 1 tablet (50 mg total) by mouth every 6 (six) hours as needed for Pain for up to 30 doses 30 tablet 0   No current Epic-ordered facility-administered medications on file.   Allergies: Allergies  Allergen Reactions  . Tetracycline Unknown    Body mass index is 30.54 kg/m.  Review of Systems: A comprehensive 14 point ROS was performed, reviewed, and the pertinent orthopaedic findings are documented in the HPI.  Vitals:  12/28/19 0806  BP: 128/74    General Physical Examination:   General/Constitutional: No apparent distress: well-nourished and well developed. Eyes: Pupils equal, round with synchronous movement. Lungs: Clear to auscultation HEENT: Normal Vascular: No  edema, swelling or tenderness, except as noted in detailed exam. Cardiac: Heart rate and rhythm is regular. Integumentary: No impressive skin lesions present, except as noted in detailed exam. Neuro/Psych: Normal mood and affect, oriented to person, place and time  Musculoskeletal Examination:  On exam, left hip has 5 degrees internal and 10 degrees external rotation. Left hip has a 10 degree flexion  contracture. Antalgic gait, left greater than right.   Radiographs:  No new imaging studies were obtained or reviewed today.  Assessment: ICD-10-CM  1. Primary osteoarthritis of left hip M16.12  2. Obesity (BMI 30.0-34.9), unspecified E66.9   Plan:  The patient has clinical findings of severe bilateral hip arthritis, left is symptomatic at this time.  We discussed the patient's prior x-ray findings. I explained his osteoarthritis has progressed. Recommendation after failure of injection, anti-inflammatory, and activity modification is for left hip arthroplasty. I explained the surgery and postoperative course in detail. The risks, benefits, and possible complications were discussed.   Surgical Risks:  The nature of the condition and the proposed procedure has been reviewed in detail with the patient. Surgical versus non-surgical options and prognosis for recovery have been reviewed and the inherent risks and benefits of each have been discussed including the risks of infection, bleeding, injury to nerves/blood vessels/tendons, incomplete relief of symptoms, persisting pain and/or stiffness, loss of function, complex regional pain syndrome, failure of the procedure, as appropriate.  Teeth: normal  Attestation: I, Dawn Royse, am documenting for [facility], MD utilizing Nuance DAX.     Electronically signed by Marlena Clipper, MD at 12/28/2019 12:24 PM EDT   Reviewed  H+P. No changes noted.

## 2020-01-08 NOTE — Anesthesia Preprocedure Evaluation (Signed)
Anesthesia Evaluation  Patient identified by MRN, date of birth, ID band Patient awake    Reviewed: Allergy & Precautions, NPO status , Patient's Chart, lab work & pertinent test results  Airway Mallampati: II       Dental   Pulmonary former smoker,    Pulmonary exam normal        Cardiovascular negative cardio ROS Normal cardiovascular exam     Neuro/Psych PSYCHIATRIC DISORDERS Depression negative neurological ROS     GI/Hepatic negative GI ROS, Neg liver ROS,   Endo/Other  negative endocrine ROS  Renal/GU negative Renal ROS  negative genitourinary   Musculoskeletal  (+) Arthritis , Osteoarthritis,    Abdominal Normal abdominal exam  (+)   Peds negative pediatric ROS (+)  Hematology  (+) anemia ,   Anesthesia Other Findings Past Medical History: No date: Anemia     Comment:  30 years hx No date: Arthritis No date: Depression No date: Gaisbock's syndrome No date: Greater trochanteric bursitis of left hip No date: Leg length discrepancy No date: OA (osteoarthritis) of hip No date: Polyarteritis nodosa (HCC) No date: Scoliosis  Reproductive/Obstetrics                             Anesthesia Physical Anesthesia Plan  ASA: II  Anesthesia Plan:    Post-op Pain Management:    Induction:   PONV Risk Score and Plan:   Airway Management Planned:   Additional Equipment:   Intra-op Plan:   Post-operative Plan:   Informed Consent:   Plan Discussed with:   Anesthesia Plan Comments:         Anesthesia Quick Evaluation

## 2020-01-08 NOTE — Op Note (Signed)
01/08/2020  9:06 AM  PATIENT:  Edwin Bryant  49 y.o. male  PRE-OPERATIVE DIAGNOSIS:  Primary osteoarthritis of left hip M16.12  POST-OPERATIVE DIAGNOSIS:  Primary osteoarthritis of left hip M16.12  PROCEDURE:  Procedure(s): TOTAL HIP ARTHROPLASTY ANTERIOR APPROACH (Left)  SURGEON: Leitha Schuller, MD  ASSISTANTS: None  ANESTHESIA:   spinal  EBL:  Total I/O In: 1200 [I.V.:1000; IV Piggyback:200] Out: 550 [Urine:400; Blood:150]  BLOOD ADMINISTERED:none  DRAINS: Incisional wound VAC   LOCAL MEDICATIONS USED:  MARCAINE    and OTHER Exparel  SPECIMEN:  Source of Specimen:  Left femoral head  DISPOSITION OF SPECIMEN:  PATHOLOGY  COUNTS:  YES  TOURNIQUET:  * No tourniquets in log *  IMPLANTS: Medacta Masterloc six lateralized stem with ceramic S 28 mm head,Mpact DM cup and liner 56 mm  DICTATION: .Dragon Dictation   The patient was brought to the operating room and after spinal anesthesia was obtained patient was placed on the operative table with the ipsilateral foot into the Medacta attachment, contralateral leg on a well-padded table. C-arm was brought in and preop template x-ray taken. After prepping and draping in usual sterile fashion appropriate patient identification and timeout procedures were completed. Anterior approach to the hip was obtained and centered over the greater trochanter and TFL muscle. The subcutaneous tissue was incised hemostasis being achieved by electrocautery. TFL fascia was incised and the muscle retracted laterally deep retractor placed. The lateral femoral circumflex vessels were identified and ligated. The anterior capsule was exposed and a capsulotomy performed. The neck was identified and a femoral neck cut carried out with a saw. The head was removed without difficulty and showed sclerotic femoral head and acetabulum. Reaming was carried out to 56 mm and a 56 mm cup trial gave appropriate tightness to the acetabular component a 56 DM cup was  impacted into position. The leg was then externally rotated and ischiofemoral and pubofemoral releases carried out. The femur was sequentially broached to a size six, size six lateralized stem with S head trials were placed and the final components chosen. The six lateralized Masterloc stem was inserted along with a ceramic S 28 mm head and 56 mm liner. The hip was reduced and was stable the wound was thoroughly irrigated with fibrillar placed along the posterior capsule and medial neck. The deep fascia ws closed using a heavy Quill after infiltration of 30 cc of quarter percent Sensorcaine with epinephrine mixed with Exparel throughout the case, .3-0 V-loc to close the skin with skin staples.  Incisional wound VAC applied and patient was sent to recovery in stable condition.   PLAN OF CARE: Discharge to home after PACU

## 2020-01-08 NOTE — Evaluation (Signed)
Physical Therapy Evaluation Patient Details Name: SAMSON RALPH MRN: [id number] DOB: Jun 10, 1970 Today's Date: 01/08/2020   History of Present Illness  Ramesses Crampton is a 49 year old male with PMH of L hip osteoarthritis. He is s/p L total hip arthoplasty anterior approach (01/08/20).    Clinical Impression  Patient presents supine in bed on arrival to room with wife bedside. He is agreeable to PT evaluation. Patient educated on precautions, and how to perform car transfers, navigate stairs, and to minimize risk for falls around the house. Patient performed exercises in bed and in recliner chair and instructed to perform throughout his day. Patient was minA+1 coming to sit EOB and needed assistance with his LE. He was CGA+1 for sit to stand transfers and ambulation with VCs for hand placement and keeping his LLE out in front of him for pain management. Patient walked 180 feet with RW broken up in two bouts. He was cued to keep it close to him and to use his arms to offset his LLE. Patient performed 4 stairs with both hands on one rail with step to pattern. He was left in chair with all needs with nurse and wife in room. Pt will benefit from skilled PT services and upon discharge outpatient PT to address deficits in strength, balance, and endurance to return to PLOF with no difficulties. This entire session was guided, instructed, and directly supervised by Elizabeth Palau, DPT.    Follow Up Recommendations Outpatient PT    Equipment Recommendations  Rolling walker with 5" wheels    Recommendations for Other Services       Precautions / Restrictions Precautions Precautions: Anterior Hip Restrictions Weight Bearing Restrictions: Yes Other Position/Activity Restrictions: Weight bearing as tolerated      Mobility  Bed Mobility Overal bed mobility: Needs Assistance Bed Mobility: Supine to Sit     Supine to sit: Min assist     General bed mobility comments: Patient able to use his UE  and move his trunk to sit EOB, but needed minA+1 with legs  Transfers Overall transfer level: Needs assistance Equipment used: Rolling walker (2 wheeled) Transfers: Sit to/from Stand Sit to Stand: Min guard         General transfer comment: Pt able to sit<>stand from bed and recliner chair. Patient cued to keep his LLE straight in front of him and to push off from the bed/arm rests.   Ambulation/Gait Ambulation/Gait assistance: Min guard Gait Distance (Feet): 180 Feet Assistive device: Rolling walker (2 wheeled) Gait Pattern/deviations: Step-to pattern;Decreased step length - right;Decreased step length - left     General Gait Details: Patient demonstrated step to pattern with decreased step lengths. He was cued to keep RW close to him and to use his arms to offset the weight of his legs. Pt has no loss of balance throughout ambulation.  Stairs Stairs: Yes Stairs assistance: Min guard Stair Management: One rail Right;One rail Left;Step to pattern Number of Stairs: 4 General stair comments: Patient instructed and demonstrated how to perform stairs before patient attemps. He uses both hands on the rails to help offset his LLE and demonstrates good step to pattern.   Wheelchair Mobility    Modified Rankin (Stroke Patients Only)       Balance Overall balance assessment: Modified Independent Sitting-balance support: No upper extremity supported;Feet supported Sitting balance-Leahy Scale: Good Sitting balance - Comments: Patient demonstrates good sitting balance without any UE support   Standing balance support: Bilateral upper extremity supported Standing balance-Leahy Scale:  Good Standing balance comment: Patient demonstrates good standing balance with BUE supported on walker                             Pertinent Vitals/Pain Pain Assessment: 0-10 Pain Score: 4  Pain Location: L hip Pain Descriptors / Indicators: Sore;Aching;Throbbing Pain Intervention(s):  Premedicated before session;Limited activity within patient's tolerance;Monitored during session;Ice applied    Home Living Family/patient expects to be discharged to:: Private residence Living Arrangements: Spouse/significant other;Children Available Help at Discharge: Family (wife and 2 teenage boys) Type of Home: House Home Access: Stairs to enter Entrance Stairs-Rails: Doctor, general practice of Steps: 5 Home Layout: Two level Home Equipment: Scientist, forensic - 2 wheels;Bedside commode Additional Comments: He will be staying on first floor for a couple of weeks until he is more mobile to go up full flight of stairs.    Prior Function Level of Independence: Independent         Comments: Patient was independent with all ADLS/IADLS without any AD. He was driving and working. He reports 0 falls in the last 6 months     Hand Dominance   Dominant Hand: Right    Extremity/Trunk Assessment   Upper Extremity Assessment Upper Extremity Assessment: Overall WFL for tasks assessed    Lower Extremity Assessment Lower Extremity Assessment: LLE deficits/detail LLE Deficits / Details: Did not assess LLE strength due to pain/surgery LLE Sensation: WNL       Communication   Communication: No difficulties  Cognition Arousal/Alertness: Awake/alert Behavior During Therapy: WFL for tasks assessed/performed Overall Cognitive Status: Within Functional Limits for tasks assessed                                        General Comments      Exercises General Exercises - Lower Extremity Ankle Circles/Pumps: AROM;Both;10 reps;Supine Quad Sets: AROM;10 reps;Supine;Left Gluteal Sets: AROM;Both;10 reps;Supine Short Arc Quad: AROM;Both;Left;10 reps;Supine Long Arc Quad: AAROM;Left;10 reps;Seated Heel Slides: AAROM;10 reps;Left;Supine Hip ABduction/ADduction: AAROM;Left;10 reps;Supine Straight Leg Raises: AAROM;Left;10 reps;Supine Hip Flexion/Marching:  AROM;Left;10 reps;Seated   Assessment/Plan    PT Assessment Patient needs continued PT services  PT Problem List Decreased strength;Decreased range of motion;Decreased activity tolerance;Decreased balance;Decreased mobility;Decreased knowledge of use of DME;Pain       PT Treatment Interventions DME instruction;Gait training;Stair training;Functional mobility training;Therapeutic activities;Therapeutic exercise;Balance training;Neuromuscular re-education    PT Goals (Current goals can be found in the Care Plan section)  Acute Rehab PT Goals Patient Stated Goal: "to go home" PT Goal Formulation: With patient/family Time For Goal Achievement: 01/22/20 Potential to Achieve Goals: Good    Frequency BID   Barriers to discharge        Co-evaluation               AM-PAC PT "6 Clicks" Mobility  Outcome Measure Help needed turning from your back to your side while in a flat bed without using bedrails?: A Little Help needed moving from lying on your back to sitting on the side of a flat bed without using bedrails?: A Little Help needed moving to and from a bed to a chair (including a wheelchair)?: A Little Help needed standing up from a chair using your arms (e.g., wheelchair or bedside chair)?: A Little Help needed to walk in hospital room?: A Little Help needed climbing 3-5 steps with a railing? : A  Little 6 Click Score: 18    End of Session Equipment Utilized During Treatment: Gait belt Activity Tolerance: Patient limited by pain Patient left: in chair;with nursing/sitter in room;with family/visitor present Nurse Communication: Mobility status PT Visit Diagnosis: Unsteadiness on feet (R26.81);Other abnormalities of gait and mobility (R26.89);Muscle weakness (generalized) (M62.81);Difficulty in walking, not elsewhere classified (R26.2);Pain Pain - Right/Left: Left Pain - part of body: Hip    Time: 5397-6734 PT Time Calculation (min) (ACUTE ONLY): 68 min   Charges:   PT  Evaluation $PT Eval Moderate Complexity: 1 Mod PT Treatments $Gait Training: 23-37 mins $Therapeutic Exercise: 23-37 mins        Kimmie Itati Brocksmith, SPT  Baker Pierini 01/08/2020, 4:49 PM

## 2020-01-08 NOTE — Discharge Instructions (Addendum)
Total Hip Replacement, Anterior, Care After This sheet gives you information about how to care for yourself after your procedure. Your health care provider may also give you more specific instructions. If you have problems or questions, contact your health care provider. What can I expect after the procedure? After the procedure, it is common to have:  Pain.  Stiffness.  Discomfort. Follow these instructions at home: Medicines  Take over-the-counter and prescription medicines only as told by your health care provider.  If you were prescribed a blood thinner (anticoagulant) to help prevent blood clots, take it as told by your health care provider. Incision care   Follow instructions from your health care provider about how to take care of your incision. Make sure you: ? Wash your hands with soap and water before you change your bandage (dressing). If soap and water are not available, use hand sanitizer. ? Change your dressing as told by your health care provider. ? Leave stitches (sutures), skin glue, or adhesive strips in place. These skin closures may need to stay in place for 2 weeks or longer. If adhesive strip edges start to loosen and curl up, you may trim the loose edges. Do not remove adhesive strips completely unless your health care provider tells you to do that.  Check your incision area every day for signs of infection. Check for: ? Redness, swelling, or pain. ? Fluid or blood. ? Warmth. ? Pus or a bad smell. Bathing  Do not take baths, swim, or use a hot tub until your health care provider approves. Ask your health care provider if you may take showers. You may only be allowed to take sponge baths.  Keep the dressing dry until your health care provider says it can be removed. Managing pain, stiffness, and swelling   If directed, put ice on the affected area. ? Put ice in a plastic bag. ? Place a towel between your skin and the bag. ? Leave the ice on for 20  minutes, 2-3 times a day.  Move your toes often to avoid stiffness and to lessen swelling.  Raise (elevate) your leg above the level of your heart while you are sitting or lying down. Activity  Rest as told by your health care provider.  Avoid sitting for a long time without moving. Get up to take short walks every 1-2 hours. This is important to improve blood flow and breathing. Ask for help if you feel weak or unsteady.  Do exercises as told by your health care provider or physical therapist.  Follow instructions from your health care provider about using a Bricco, crutches, or a cane. ? You may use your legs to support (bear) your body weight as told by your health care provider. Follow instructions about how much weight you may safely support on your affected leg (weight-bearing restrictions). ? A physical therapist may show you how to get out of a bed and chair and how to go up and down stairs. You will first do this with a Axelrod, crutches, or a cane and then without any of these devices. ? Once you are able to walk without a limp, you may stop using a Morris, crutches, or cane.  Return to your normal activities as told by your health care provider. Ask your health care provider what activities are safe for you. Safety  To help prevent falls, keep floors clear of objects you may trip over, and place items that you may need within easy reach.  Wear  an apron or tool belt with pockets for carrying objects. This leaves your hands free to help with your balance. Driving  Do not drive or use heavy machinery while taking prescription pain medicine.  Ask your health care provider when it is safe to drive. General instructions  Wear compression stockings as told by your health care provider. These stockings help to prevent blood clots and reduce swelling in your legs.  Continue with breathing exercises as directed by your health care provider. This helps prevent lung infection.  If  you are taking prescription pain medicine, take actions to prevent or treat constipation. Your health care provider may recommend that you: ? Drink enough fluid to keep your urine pale yellow. ? Eat foods that are high in fiber, such as fresh fruits and vegetables, whole grains, and beans. ? Limit foods that are high in fat and processed sugars, such as fried or sweet foods. ? Take an over-the-counter or prescription medicine for constipation.  Do not use any products that contain nicotine or tobacco, such as cigarettes and e-cigarettes. These can delay bone healing. If you need help quitting, ask your health care provider.  Tell your health care provider if you plan to have dental work. Also: ? Tell your dentist about your joint replacement. ? Ask your health care provider if there are any special instructions you need to follow before having dental care and routine cleanings.  Keep all follow-up visits as told by your health care provider. This is important. Contact a health care provider if:  You have a fever or chills.  You have a cough or feel short of breath.  Your medicine is not controlling your pain.  You have redness, swelling, or pain around your incision.  You have fluid or blood coming from your incision.  Your incision feels warm to the touch.  You have pus or a bad smell coming from your incision. Get help right away if you have:  Severe pain.  Trouble breathing.  Chest pain.  Redness, swelling, pain, and warmth in your calf or leg. Summary  Follow instructions from your health care provider about how to take care of your incision.  Do not take baths, swim, or use a hot tub until your health care provider approves.  Use crutches, a walker, or a cane as told by your health care provider.  If you were prescribed a blood thinner (anticoagulant) to help prevent blood clots, take it as told by your health care provider. This information is not intended to  replace advice given to you by your health care provider. Make sure you discuss any questions you have with your health care provider. Document Revised: 07/31/2018 Document Reviewed: 07/06/2017 Elsevier Patient Education  2020 [employer].   Call office if you need refill of pain medication. Okay to shower. Leave Prevena dressing in place for 1 week when it stops working home health can change it. Use a walker for 2 weeks.  Weightbearing as tolerated on left leg.   AMBULATORY SURGERY  DISCHARGE INSTRUCTIONS   1) The drugs that you were given will stay in your system until tomorrow so for the next 24 hours you should not:  A) Drive an automobile B) Make any legal decisions C) Drink any alcoholic beverage   2) You may resume regular meals tomorrow.  Today it is better to start with liquids and gradually work up to solid foods.  You may eat anything you prefer, but it is better to start  with liquids, then soup and crackers, and gradually work up to solid foods.   3) Please notify your doctor immediately if you have any unusual bleeding, trouble breathing, redness and pain at the surgery site, drainage, fever, or pain not relieved by medication.    4) Additional Instructions:        Please contact your physician with any problems or Same Day Surgery at [phone], Monday through Friday 6 am to 4 pm, or [place] at [facility] number at [phone].

## 2020-01-08 NOTE — Anesthesia Procedure Notes (Signed)
Spinal  Patient location during procedure: OR Staffing Performed: resident/CRNA  Anesthesiologist: Alvin Critchley, MD Resident/CRNA: Rolla Plate, CRNA Preanesthetic Checklist Completed: patient identified, IV checked, site marked, risks and benefits discussed, surgical consent, monitors and equipment checked, pre-op evaluation and timeout performed Spinal Block Patient position: sitting Prep: ChloraPrep and site prepped and draped Patient monitoring: heart rate, continuous pulse ox, blood pressure and cardiac monitor Approach: midline Location: L4-5 Injection technique: single-shot Needle Needle type: Whitacre and Introducer  Needle gauge: 24 G Needle length: 9 cm Additional Notes Negative paresthesia. Negative blood return. Positive free-flowing CSF. Expiration date of kit checked and confirmed. Patient tolerated procedure well, without complications.

## 2020-01-09 ENCOUNTER — Encounter: Payer: Self-pay | Admitting: Orthopedic Surgery

## 2020-01-10 LAB — SURGICAL PATHOLOGY

## 2020-01-10 NOTE — Anesthesia Postprocedure Evaluation (Signed)
Anesthesia Post Note  Patient: Edwin Bryant  Procedure(s) Performed: TOTAL HIP ARTHROPLASTY ANTERIOR APPROACH (Left Hip)  Anesthesia Type: Spinal Anesthetic complications: no Comments: Pt discharged to home before follow up   No complications documented.   Last Vitals:  Vitals:   01/08/20 1146 01/08/20 1224  BP: 129/82 114/68  Pulse: (!) 58 63  Resp: 15 16  Temp: (!) 36.1 C (!) 35.9 C  SpO2: 100% 100%    Last Pain:  Vitals:   01/08/20 1331  TempSrc:   PainSc: 5                  Jules Schick

## 2020-10-02 ENCOUNTER — Other Ambulatory Visit: Payer: Self-pay | Admitting: Family Medicine

## 2020-10-02 DIAGNOSIS — R748 Abnormal levels of other serum enzymes: Secondary | ICD-10-CM

## 2020-10-07 ENCOUNTER — Ambulatory Visit
Admission: RE | Admit: 2020-10-07 | Discharge: 2020-10-07 | Disposition: A | Discharge status: Home / Self Care | Payer: Managed Care, Other (non HMO) | Source: Ambulatory Visit | Attending: Family Medicine | Admitting: Family Medicine

## 2020-10-07 ENCOUNTER — Other Ambulatory Visit: Payer: Self-pay

## 2020-10-07 DIAGNOSIS — R748 Abnormal levels of other serum enzymes: Secondary | ICD-10-CM | POA: Insufficient documentation

## 2022-01-02 IMAGING — XA DG HIP (WITH PELVIS) OPERATIVE*L*
2 series · 5 of 5 positions shown · non-contrast
Comparison: None.

CLINICAL DATA: Left hip arthroplasty

EXAM:
OPERATIVE LEFT HIP WITH PELVIS

[Series 1: ortho standard · 1 of 1 slices shown (1 of 2)]
[im 1/1]
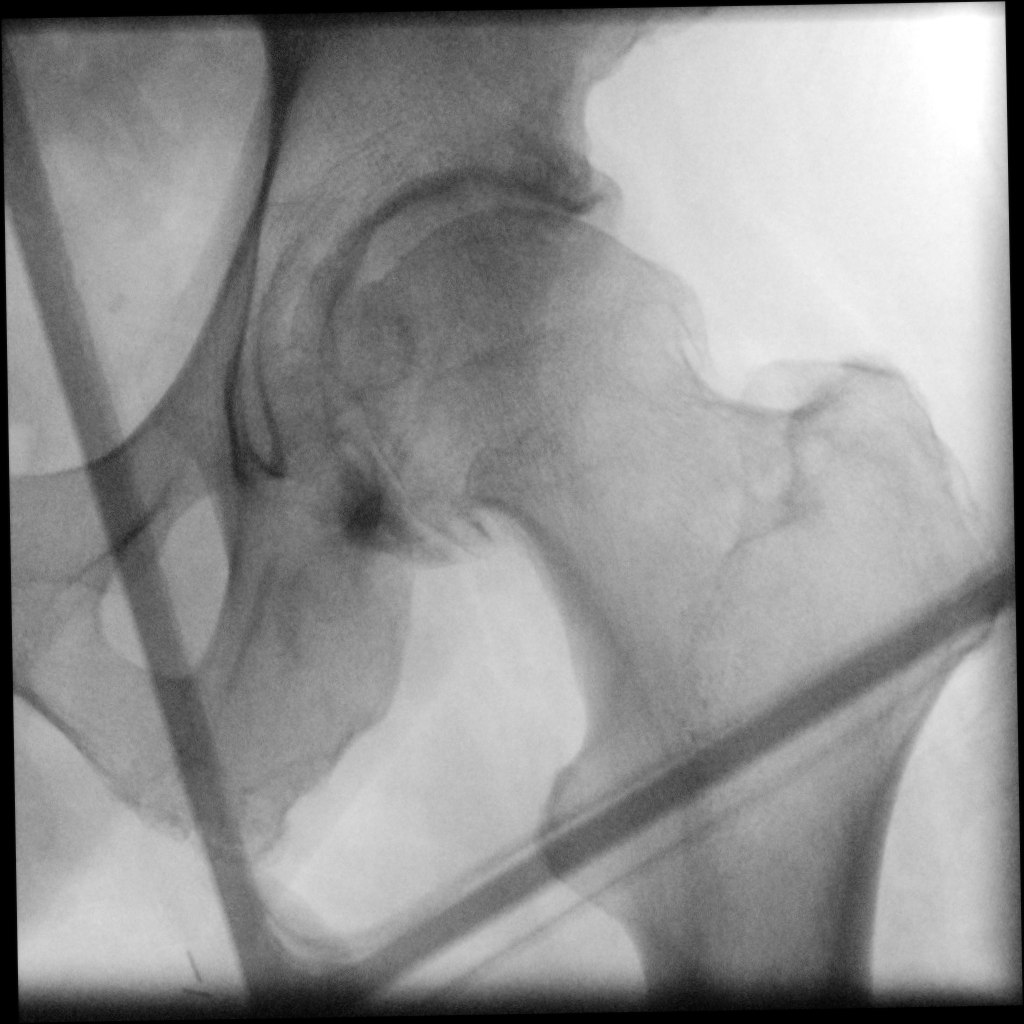

[Series 8: ortho standard · 4 of 11 frames shown (2 of 2)]
[frame 1/11]
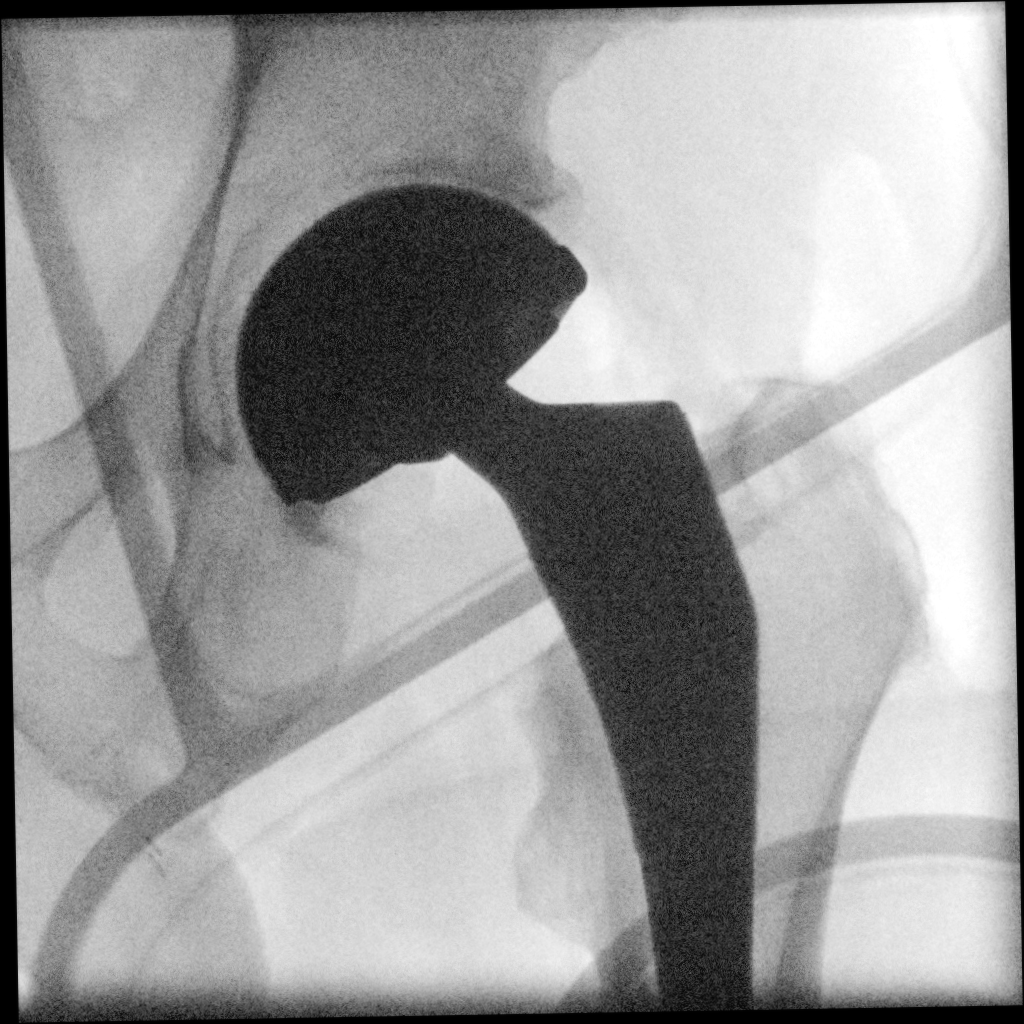
[frame 2/11]
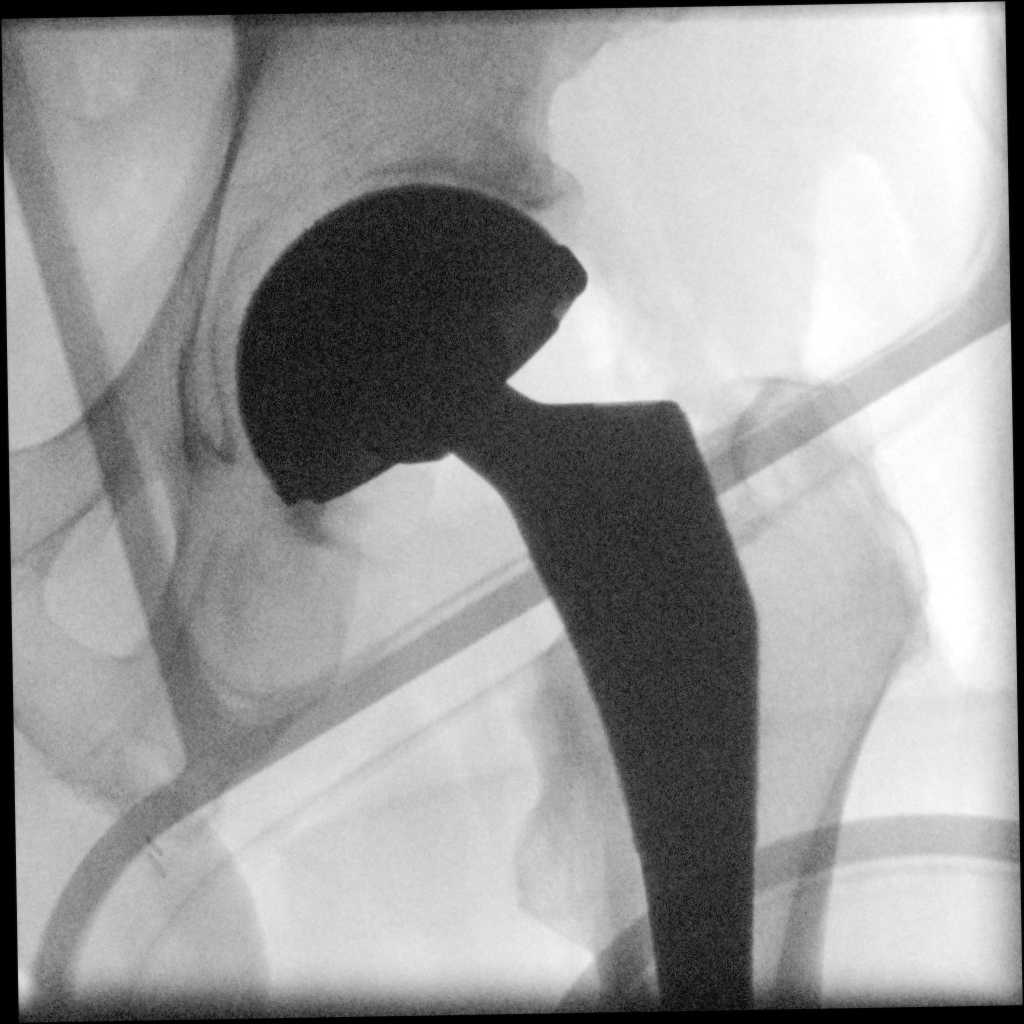
[frame 6/11]
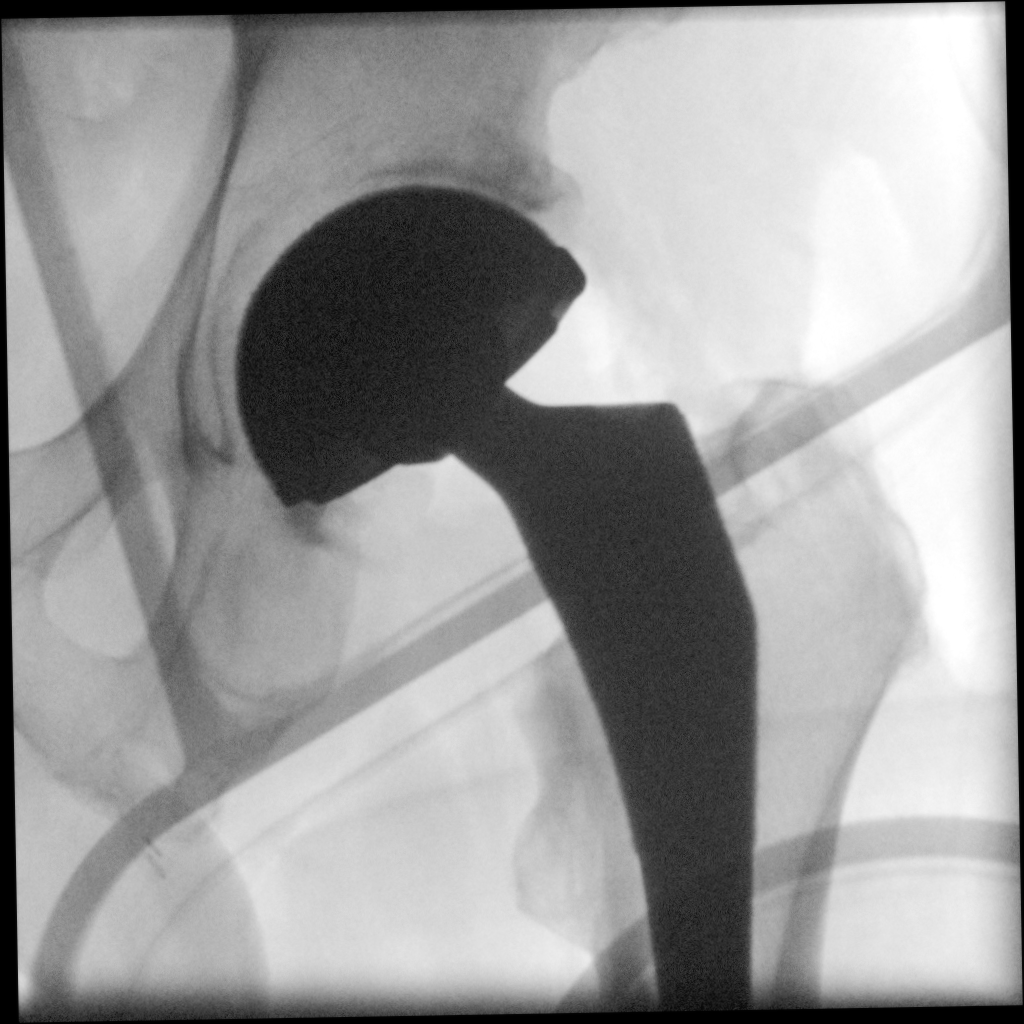
[frame 10/11]
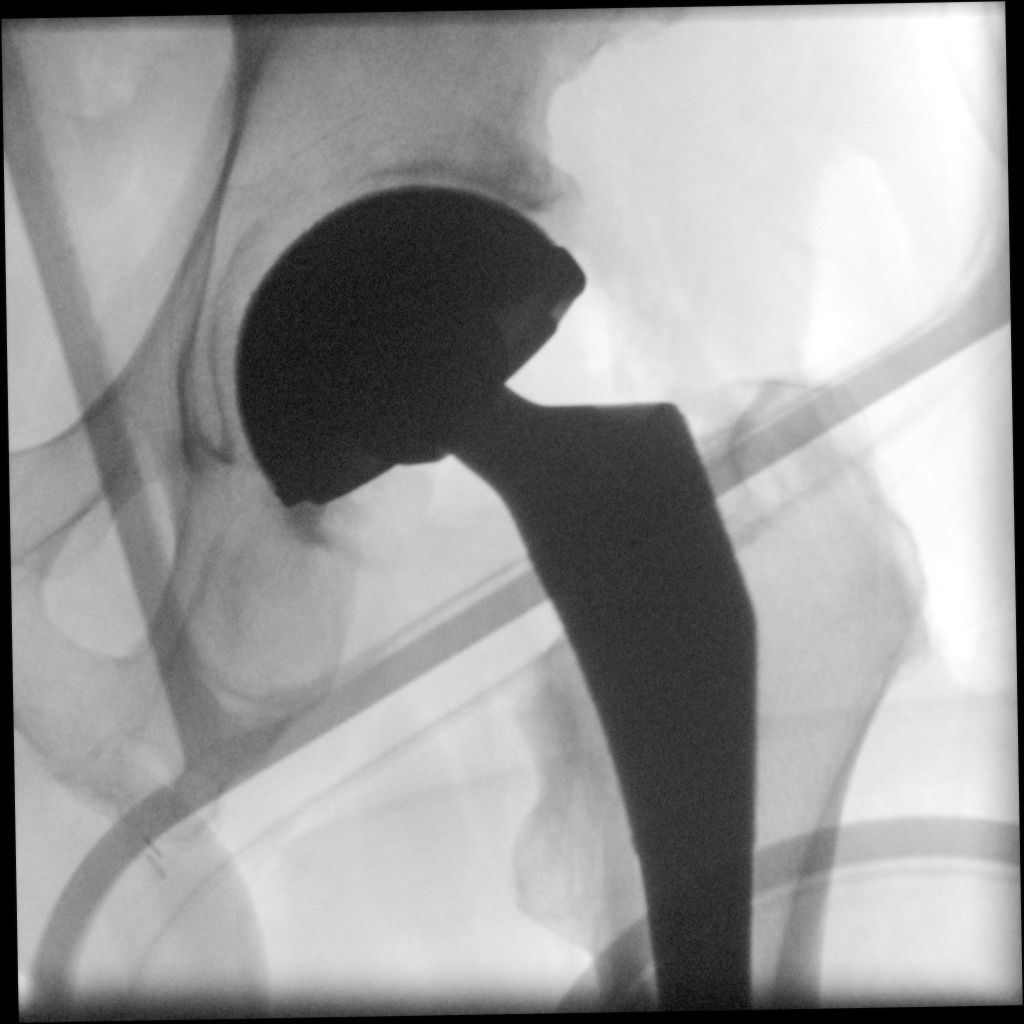

[5 of 5 positions shown; findings below may reference images not displayed]

FLUOROSCOPY TIME:  Fluoroscopy Time:  0 minutes 24 seconds

Number of Acquired Spot Images: 2
FINDINGS: Nondiagnostic spot fluoroscopic intraoperative left hip radiographs
demonstrate postsurgical changes from left total hip arthroplasty.
IMPRESSION: Intraoperative fluoroscopic guidance for left total hip
arthroplasty.

## 2022-10-02 IMAGING — US US ABDOMEN LIMITED
1 series · 14 of 25 positions shown · non-contrast
Comparison: None.

CLINICAL DATA: Elevated LFTs

EXAM:
ULTRASOUND ABDOMEN LIMITED RIGHT UPPER QUADRANT

[Series 1: us abdomen limited ruq (liver/gb) · 14 of 58 slices shown]
[im 1/58]
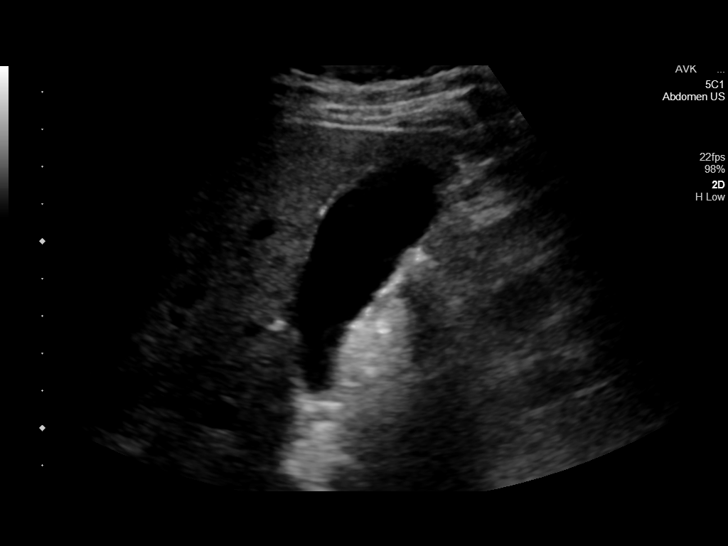
[im 5/58]
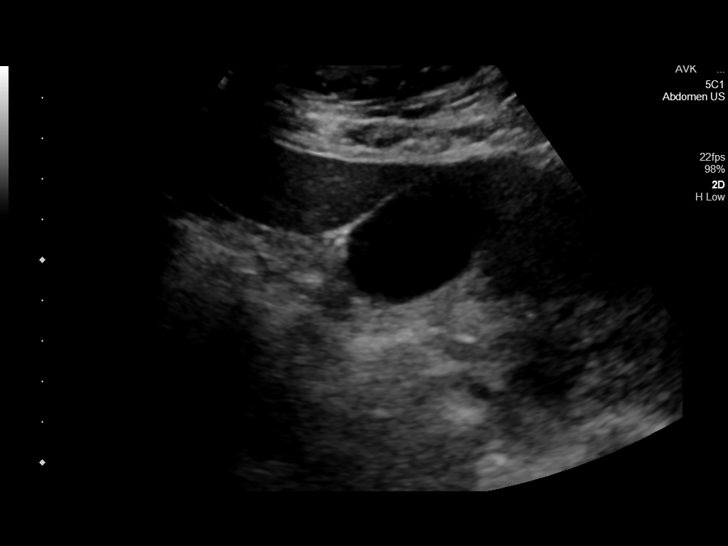
[im 10/58]
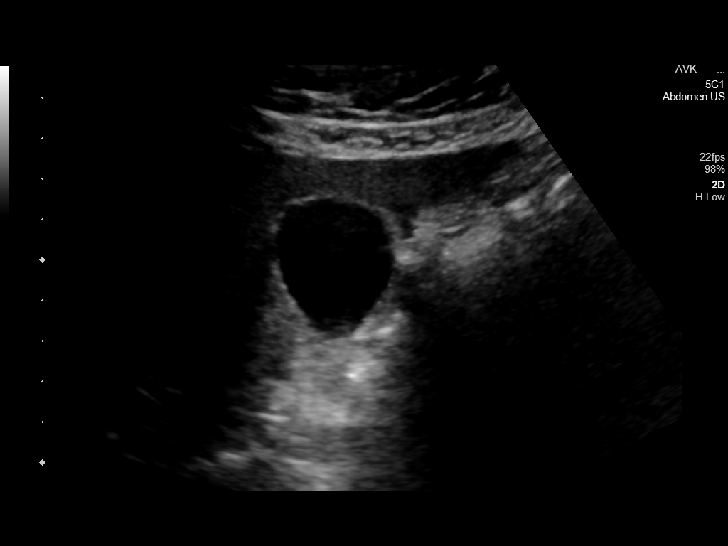
[im 15/58]
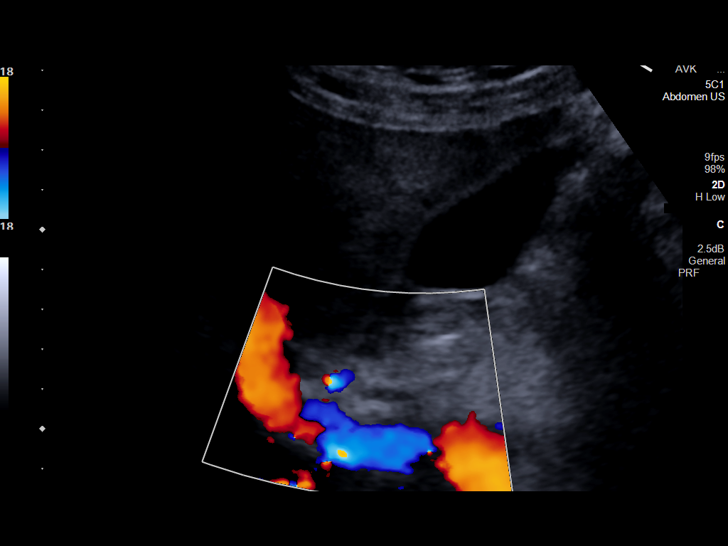
[im 20/58]
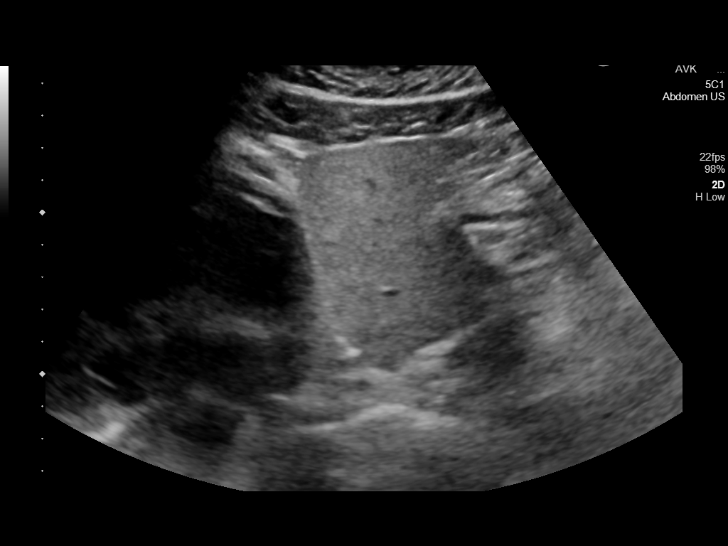
[im 22/58]
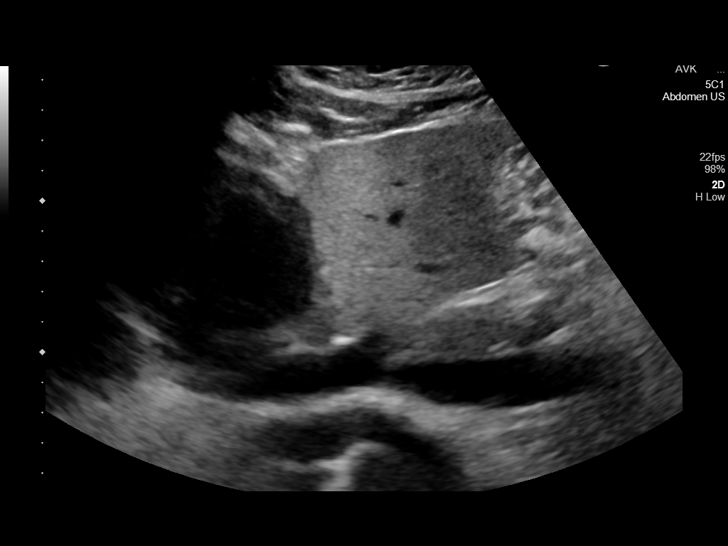
[im 27/58]
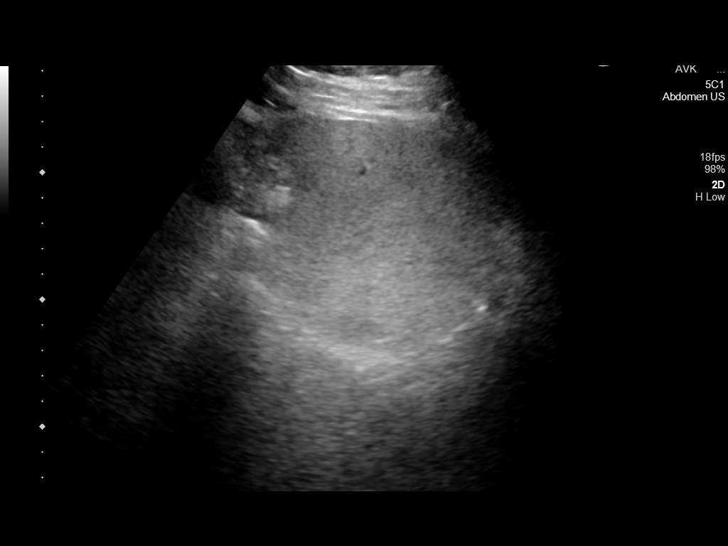
[im 31/58]
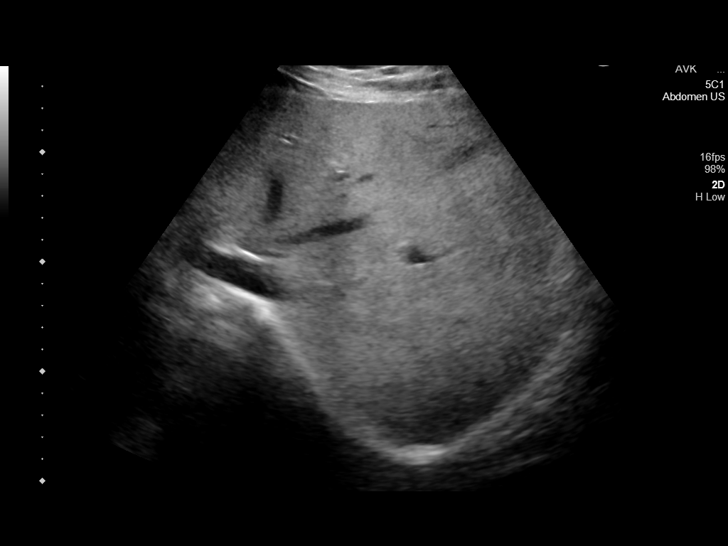
[im 36/58]
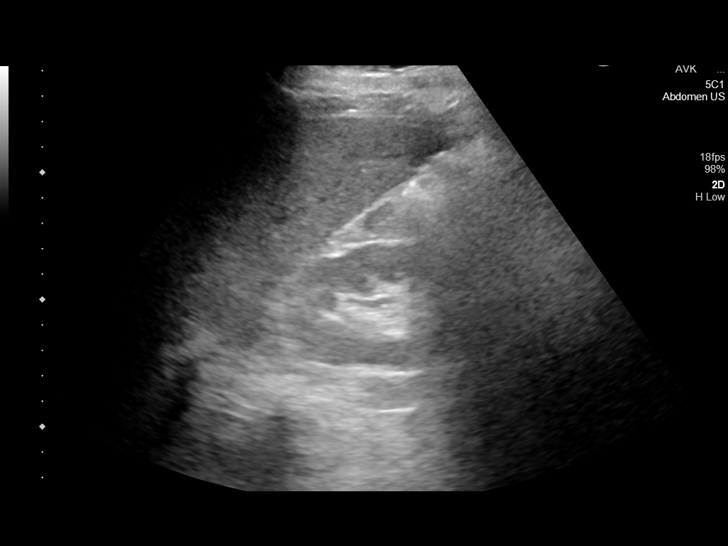
[im 39/58]
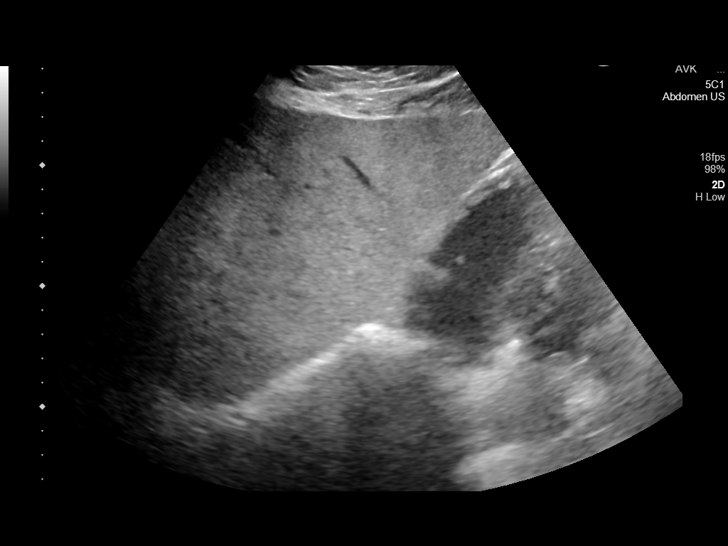
[im 43/58]
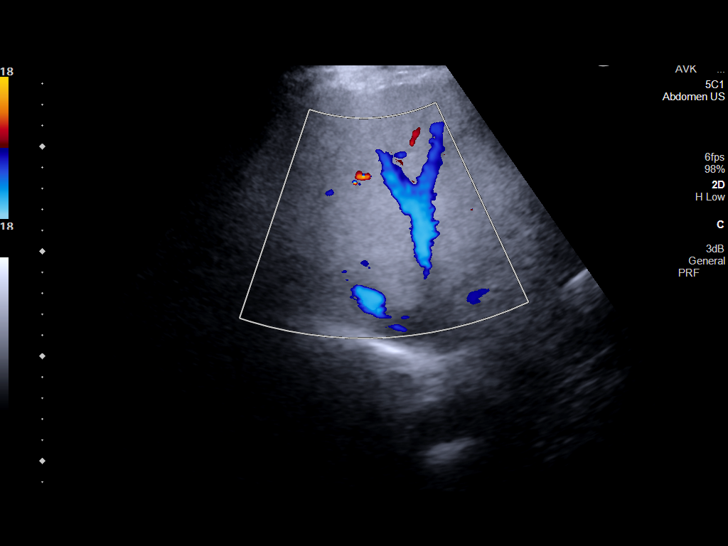
[im 48/58]
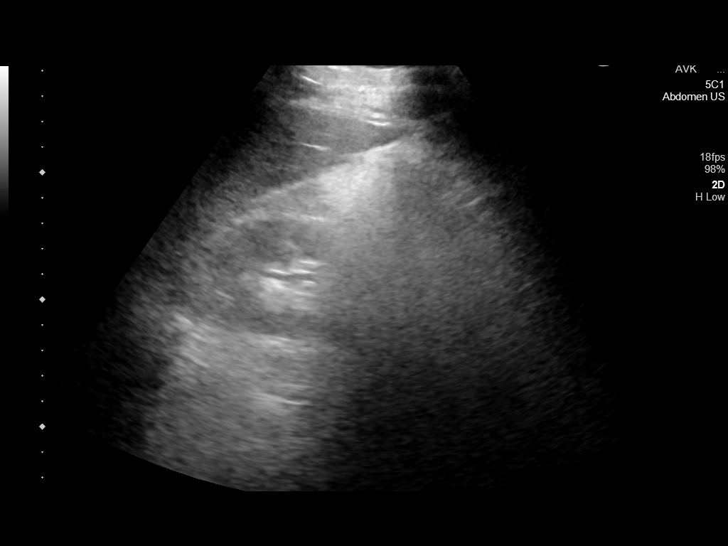
[im 53/58]
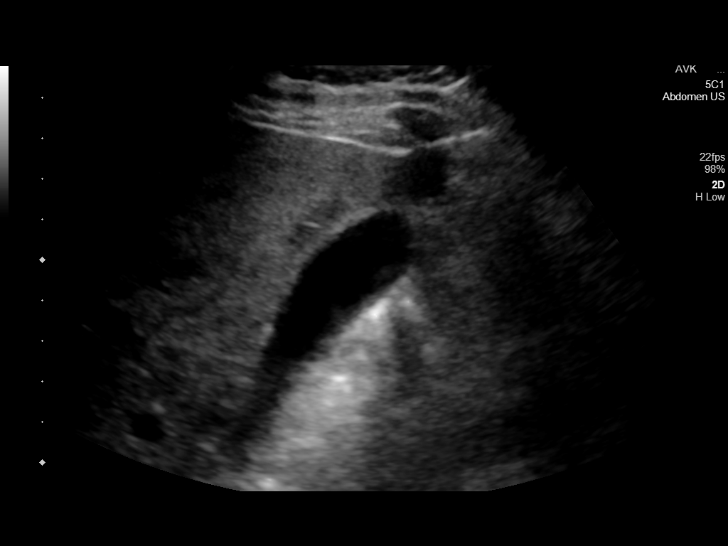
[im 58/58]
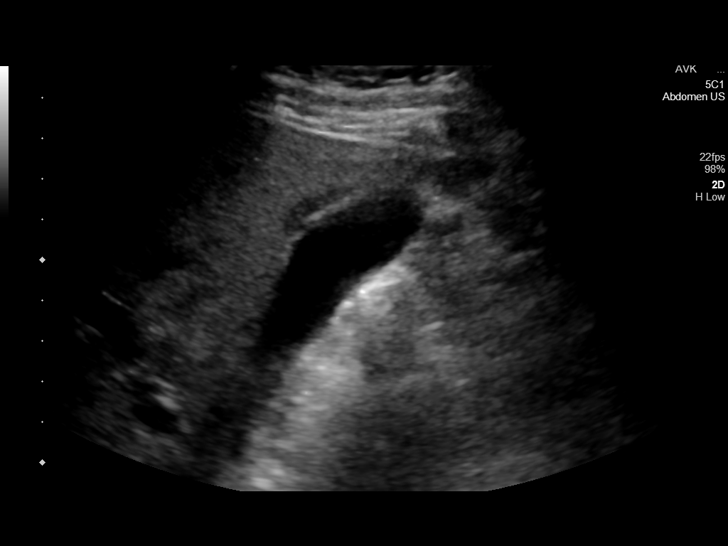

[14 of 25 positions shown; findings below may reference images not displayed]

FINDINGS: Gallbladder:

No gallstones or wall thickening visualized. No sonographic Murphy
sign noted by sonographer.

Common bile duct:

Diameter: 3 mm

Liver:

Diffusely increased parenchymal echogenicity. Relative hypoechoic
area along the gallbladder fossa measuring 1.7 cm in maximum
dimension. Portal vein is patent on color Doppler imaging with
normal direction of blood flow towards the liver.

Other: None.
IMPRESSION: The echogenicity of the liver is increased. This is a nonspecific
finding but is most commonly seen with fatty infiltration of the
liver. Hypoechoic area along the gallbladder fossa measuring 1.7 cm,
favored to represent focal fatty sparing.
